# Patient Record
Sex: Male | Born: 1980 | Race: Black or African American | Hispanic: No | Marital: Single | Smoking: Never smoker
Health system: Southern US, Community
[De-identification: ages and names within clinical notes are randomized; demographics above are authoritative.]

## PROBLEM LIST (undated history)

## (undated) DIAGNOSIS — N4400 Torsion of testis, unspecified: Secondary | ICD-10-CM

## (undated) DIAGNOSIS — I10 Essential (primary) hypertension: Secondary | ICD-10-CM

## (undated) HISTORY — PX: TONSILLECTOMY: SUR1361

---
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
## 2002-07-04 ENCOUNTER — Emergency Department (HOSPITAL_COMMUNITY): Admission: AC | Admit: 2002-07-04 | Discharge: 2002-07-04 | Payer: Self-pay

## 2002-07-04 ENCOUNTER — Encounter: Payer: Self-pay | Admitting: Emergency Medicine

## 2002-10-07 HISTORY — PX: OTHER SURGICAL HISTORY: SHX169

## 2003-09-22 ENCOUNTER — Ambulatory Visit (HOSPITAL_BASED_OUTPATIENT_CLINIC_OR_DEPARTMENT_OTHER): Admission: RE | Admit: 2003-09-22 | Discharge: 2003-09-22 | Payer: Self-pay | Admitting: General Surgery

## 2008-10-07 HISTORY — PX: ANTERIOR CRUCIATE LIGAMENT REPAIR: SHX115

## 2010-10-07 HISTORY — PX: MENISCUS REPAIR: SHX5179

## 2014-10-07 DIAGNOSIS — N4400 Torsion of testis, unspecified: Secondary | ICD-10-CM

## 2014-10-07 HISTORY — DX: Torsion of testis, unspecified: N44.00

## 2014-10-20 ENCOUNTER — Encounter (HOSPITAL_COMMUNITY): Payer: Self-pay | Admitting: Emergency Medicine

## 2014-10-20 ENCOUNTER — Emergency Department (HOSPITAL_COMMUNITY): Payer: Commercial Indemnity | Admitting: Anesthesiology

## 2014-10-20 ENCOUNTER — Emergency Department (HOSPITAL_COMMUNITY): Payer: Commercial Indemnity

## 2014-10-20 ENCOUNTER — Ambulatory Visit (HOSPITAL_COMMUNITY)
Admission: EM | Admit: 2014-10-20 | Discharge: 2014-10-20 | Disposition: A | Discharge status: Home / Self Care | Payer: Commercial Indemnity | Attending: Emergency Medicine | Admitting: Emergency Medicine

## 2014-10-20 ENCOUNTER — Encounter (HOSPITAL_COMMUNITY)
Admission: EM | Disposition: A | Discharge status: Home / Self Care | Payer: Commercial Indemnity | Source: Home / Self Care | Attending: Emergency Medicine

## 2014-10-20 DIAGNOSIS — N4400 Torsion of testis, unspecified: Secondary | ICD-10-CM | POA: Diagnosis not present

## 2014-10-20 DIAGNOSIS — Z87891 Personal history of nicotine dependence: Secondary | ICD-10-CM | POA: Insufficient documentation

## 2014-10-20 DIAGNOSIS — N508 Other specified disorders of male genital organs: Secondary | ICD-10-CM | POA: Diagnosis present

## 2014-10-20 DIAGNOSIS — N50812 Left testicular pain: Secondary | ICD-10-CM

## 2014-10-20 HISTORY — PX: SCROTAL EXPLORATION: SHX2386

## 2014-10-20 LAB — BASIC METABOLIC PANEL WITH GFR
Anion gap: 8 (ref 5–15)
BUN: 14 mg/dL (ref 6–23)
CO2: 26 mmol/L (ref 19–32)
Calcium: 9.6 mg/dL (ref 8.4–10.5)
Chloride: 107 meq/L (ref 96–112)
Creatinine, Ser: 1.2 mg/dL (ref 0.50–1.35)
GFR calc Af Amer: >90 mL/min
GFR calc non Af Amer: 79 mL/min — ABNORMAL LOW
Glucose, Bld: 115 mg/dL — ABNORMAL HIGH (ref 70–99)
Potassium: 4.5 mmol/L (ref 3.5–5.1)
Sodium: 141 mmol/L (ref 135–145)

## 2014-10-20 LAB — CBC WITH DIFFERENTIAL/PLATELET
BASOS ABS: 0 10*3/uL (ref 0.0–0.1)
Basophils Relative: 0 % (ref 0–1)
EOS ABS: 0.3 10*3/uL (ref 0.0–0.7)
EOS PCT: 5 % (ref 0–5)
HEMATOCRIT: 44.2 % (ref 39.0–52.0)
Hemoglobin: 15.5 g/dL (ref 13.0–17.0)
LYMPHS PCT: 33 % (ref 12–46)
Lymphs Abs: 2.5 10*3/uL (ref 0.7–4.0)
MCH: 28.5 pg (ref 26.0–34.0)
MCHC: 35.1 g/dL (ref 30.0–36.0)
MCV: 81.4 fL (ref 78.0–100.0)
MONO ABS: 0.7 10*3/uL (ref 0.1–1.0)
Monocytes Relative: 9 % (ref 3–12)
NEUTROS PCT: 53 % (ref 43–77)
Neutro Abs: 4 10*3/uL (ref 1.7–7.7)
PLATELETS: 248 10*3/uL (ref 150–400)
RBC: 5.43 MIL/uL (ref 4.22–5.81)
RDW: 13.1 % (ref 11.5–15.5)
WBC: 7.5 10*3/uL (ref 4.0–10.5)

## 2014-10-20 SURGERY — EXPLORATION, SCROTUM
Anesthesia: General | Site: Scrotum | Laterality: Bilateral

## 2014-10-20 MED ORDER — BUPIVACAINE HCL (PF) 0.25 % IJ SOLN
INTRAMUSCULAR | Status: DC | PRN
  Administered 2014-10-20: 10 mL

## 2014-10-20 MED ORDER — SUCCINYLCHOLINE CHLORIDE 20 MG/ML IJ SOLN
INTRAMUSCULAR | Status: DC | PRN
  Administered 2014-10-20: 60 mg via INTRAVENOUS

## 2014-10-20 MED ORDER — PROPOFOL 10 MG/ML IV BOLUS
INTRAVENOUS | Status: DC | PRN
  Administered 2014-10-20: 200 mg via INTRAVENOUS

## 2014-10-20 MED ORDER — CEFAZOLIN SODIUM-DEXTROSE 2-3 GM-% IV SOLR
INTRAVENOUS | Status: AC
  Filled 2014-10-20: qty 50

## 2014-10-20 MED ORDER — 0.9 % SODIUM CHLORIDE (POUR BTL) OPTIME
TOPICAL | Status: DC | PRN
  Administered 2014-10-20: 1000 mL

## 2014-10-20 MED ORDER — ONDANSETRON HCL 4 MG/2ML IJ SOLN
INTRAMUSCULAR | Status: AC
  Filled 2014-10-20: qty 2

## 2014-10-20 MED ORDER — CEFAZOLIN SODIUM-DEXTROSE 2-3 GM-% IV SOLR: 2.0000 g | Freq: Once | INTRAVENOUS | Status: DC

## 2014-10-20 MED ORDER — MIDAZOLAM HCL 2 MG/2ML IJ SOLN
INTRAMUSCULAR | Status: AC
  Filled 2014-10-20: qty 2

## 2014-10-20 MED ORDER — BUPIVACAINE HCL (PF) 0.25 % IJ SOLN
INTRAMUSCULAR | Status: AC
  Filled 2014-10-20: qty 30

## 2014-10-20 MED ORDER — ACETAMINOPHEN 325 MG PO TABS: 325.0000 mg | ORAL_TABLET | ORAL | Status: DC | PRN

## 2014-10-20 MED ORDER — PROPOFOL 10 MG/ML IV BOLUS
INTRAVENOUS | Status: AC
  Filled 2014-10-20: qty 20

## 2014-10-20 MED ORDER — OXYCODONE HCL 5 MG PO TABS: 5.0000 mg | ORAL_TABLET | Freq: Once | ORAL | Status: DC | PRN

## 2014-10-20 MED ORDER — HYDROMORPHONE HCL 1 MG/ML IJ SOLN
1.0000 mg | Freq: Once | INTRAMUSCULAR | Status: AC
  Administered 2014-10-20: 1 mg via INTRAVENOUS
  Filled 2014-10-20: qty 1

## 2014-10-20 MED ORDER — OXYCODONE HCL 5 MG PO TABS
ORAL_TABLET | ORAL | Status: AC
  Filled 2014-10-20: qty 1

## 2014-10-20 MED ORDER — FENTANYL CITRATE 0.05 MG/ML IJ SOLN
INTRAMUSCULAR | Status: AC
  Filled 2014-10-20: qty 5

## 2014-10-20 MED ORDER — FENTANYL CITRATE 0.05 MG/ML IJ SOLN
INTRAMUSCULAR | Status: DC | PRN
  Administered 2014-10-20: 100 ug via INTRAVENOUS

## 2014-10-20 MED ORDER — LIDOCAINE HCL (CARDIAC) 20 MG/ML IV SOLN
INTRAVENOUS | Status: AC
  Filled 2014-10-20: qty 5

## 2014-10-20 MED ORDER — SUCCINYLCHOLINE CHLORIDE 20 MG/ML IJ SOLN
INTRAMUSCULAR | Status: AC
Start: 2014-10-20 — End: 2014-10-20
  Filled 2014-10-20: qty 1

## 2014-10-20 MED ORDER — LACTATED RINGERS IV SOLN
INTRAVENOUS | Status: DC | PRN
  Administered 2014-10-20 (×2): via INTRAVENOUS

## 2014-10-20 MED ORDER — ARTIFICIAL TEARS OP OINT
TOPICAL_OINTMENT | OPHTHALMIC | Status: AC
  Filled 2014-10-20: qty 3.5

## 2014-10-20 MED ORDER — HYDROCODONE-ACETAMINOPHEN 5-325 MG PO TABS: 1.0000 | ORAL_TABLET | ORAL | Status: AC | PRN

## 2014-10-20 MED ORDER — OXYCODONE HCL 5 MG/5ML PO SOLN: 5.0000 mg | Freq: Once | ORAL | Status: DC | PRN

## 2014-10-20 MED ORDER — HYDROMORPHONE HCL 1 MG/ML IJ SOLN: 0.2500 mg | INTRAMUSCULAR | Status: DC | PRN

## 2014-10-20 MED ORDER — LIDOCAINE HCL (CARDIAC) 20 MG/ML IV SOLN
INTRAVENOUS | Status: DC | PRN
  Administered 2014-10-20: 80 mg via INTRAVENOUS

## 2014-10-20 MED ORDER — ONDANSETRON HCL 4 MG/2ML IJ SOLN
INTRAMUSCULAR | Status: DC | PRN
  Administered 2014-10-20: 4 mg via INTRAVENOUS

## 2014-10-20 MED ORDER — ACETAMINOPHEN 160 MG/5ML PO SOLN
325.0000 mg | ORAL | Status: DC | PRN
  Filled 2014-10-20: qty 20.3

## 2014-10-20 SURGICAL SUPPLY — 34 items
BAG URO CATCHER STRL LF (DRAPE) IMPLANT
BLADE 10 SAFETY STRL DISP (BLADE) ×3 IMPLANT
BLADE SURG 15 STRL LF DISP TIS (BLADE) IMPLANT
BLADE SURG 15 STRL SS (BLADE)
BNDG GAUZE ELAST 4 BULKY (GAUZE/BANDAGES/DRESSINGS) ×3 IMPLANT
CANISTER SUCTION 2500CC (MISCELLANEOUS) ×3 IMPLANT
CONT SPEC 4OZ CLIKSEAL STRL BL (MISCELLANEOUS) ×3 IMPLANT
DRAPE LAPAROTOMY T 102X78X121 (DRAPES) ×3 IMPLANT
DRSG PAD ABDOMINAL 8X10 ST (GAUZE/BANDAGES/DRESSINGS) ×6 IMPLANT
ELECT REM PT RETURN 9FT ADLT (ELECTROSURGICAL) ×3
ELECTRODE REM PT RTRN 9FT ADLT (ELECTROSURGICAL) ×1 IMPLANT
GAUZE SPONGE 4X4 12PLY STRL (GAUZE/BANDAGES/DRESSINGS) ×3 IMPLANT
GLOVE BIO SURGEON STRL SZ 6.5 (GLOVE) ×4 IMPLANT
GLOVE BIO SURGEONS STRL SZ 6.5 (GLOVE) ×2
GLOVE SURG ORTHO 8.0 STRL STRW (GLOVE) ×3 IMPLANT
GLOVE SURG SS PI 7.0 STRL IVOR (GLOVE) ×6 IMPLANT
KIT BASIN OR (CUSTOM PROCEDURE TRAY) ×3 IMPLANT
KIT ROOM TURNOVER OR (KITS) ×3 IMPLANT
NS IRRIG 1000ML POUR BTL (IV SOLUTION) ×3 IMPLANT
PACK GENERAL/GYN (CUSTOM PROCEDURE TRAY) ×3 IMPLANT
PAD ARMBOARD 7.5X6 YLW CONV (MISCELLANEOUS) ×6 IMPLANT
SOLUTION BETADINE 4OZ (MISCELLANEOUS) ×3 IMPLANT
SPONGE LAP 18X18 X RAY DECT (DISPOSABLE) IMPLANT
SUPPORT SCROTAL LG STRP (MISCELLANEOUS) ×2 IMPLANT
SUPPORT SCROTAL MEDIUM (SOFTGOODS) ×3 IMPLANT
SUPPORTER ATHLETIC LG (MISCELLANEOUS) ×1
SUT CHROMIC 3 0 SH 27 (SUTURE) ×3 IMPLANT
SUT MNCRL AB 4-0 PS2 18 (SUTURE) ×3 IMPLANT
SUT PROLENE 4 0 PS 2 18 (SUTURE) ×3 IMPLANT
SUT VICRYL 4-0 PS2 18IN ABS (SUTURE) IMPLANT
SWAB COLLECTION DEVICE MRSA (MISCELLANEOUS) IMPLANT
TOWEL OR 17X24 6PK STRL BLUE (TOWEL DISPOSABLE) ×3 IMPLANT
TOWEL OR 17X26 10 PK STRL BLUE (TOWEL DISPOSABLE) ×3 IMPLANT
WATER STERILE IRR 1000ML POUR (IV SOLUTION) ×3 IMPLANT

## 2014-10-20 NOTE — ED Notes (Signed)
Pt states that he was driving to work yesterday morning when he started experiencing sharp lower abdominal pain radiating to the left testicle. Pt states that pain continued to worsen, and was not able to sleep. Pt states that his left testicle is swollen. 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

## 2014-10-20 NOTE — Transfer of Care (Signed)
Immediate Anesthesia Transfer of Care Note  Patient: Allen Castaneda  Procedure(s) Performed: Procedure(s): SCROTUM EXPLORATION with bilateral orchiopexy (Bilateral)  Patient Location: PACU  Anesthesia Type:General  Level of Consciousness: awake, sedated and patient cooperative  Airway & Oxygen Therapy: Patient Spontanous Breathing and Patient connected to nasal cannula oxygen  Post-op Assessment: Report given to PACU RN and Post -op Vital signs reviewed and stable  Post vital signs: Reviewed and stable  Complications: No apparent anesthesia complications

## 2014-10-20 NOTE — Op Note (Signed)
Preoperative diagnosis: Left testicle torsion  Postoperative diagnosis: Same, with viable testicle  Principal procedure: Scrotal exploration, bilateral orchidopexy  Surgeon: Eddy Termine  Anesthesia: Gen.  Drains: None  Specimens: None  Indications: 34 year old male who presented to the emergency room about 3 hours ago with a 24-hour history of left testicular pain. Scrotal ultrasound with Doppler revealed no flow to the left testicle with evidence of torsion. The patient presents at this time for scrotal exploration, possible left orchiectomy versus orchiopexy, right orchidopexy. Risks and complications of the procedure have been discussed, including the need for possible removal of the testicle. He understands and desires to proceed.  Description of procedure:  Patient was identified and properly marked in the holding area. He received 2 g of Ancef IV preoperatively. He was taken the operating room where general anesthetic was administered. He is placed in the recumbent position on the table. Perineum and lower abdomen were prepped and draped. Proper timeout was performed.  A 2 cm incision was made in the anterior median raphae and carried down to the left tunica vaginalis with electrocautery. The tunica was divided anteriorly, and the testicle underneath identified. There was a 360 torsion. There was significant edema of the epididymis and the appendix testicle. The testicle did not appear to be infarcted. I detorsed the testicle for observation. I then incised down to the right tunica vaginalis with electrocautery. The tunica vaginalis was divided, and the testicle on the right inspected. This was found to be normal. Epididymal structures were normal as well. Orchidopexy was then performed using 4-0 Prolene, tacking the right tunica albuginea into the dartos fascia in 3 separate sutures placed in a triangular fashion. Inspection was then turned to the left testicle and epididymis. There seem to  be adequate circulation, and there was no evidence of infarction, as after 3-4 minutes, the left testicle, although slightly edematous with significant edema of the epididymis, seem to be viable in appearance. Orchidopexy on the left was then performed in the same way with 4-0 Prolene, approximating the tunica albuginea to the dartos in a triangular sort of fashion with 3 separate sutures. The cord was then blocked with quarter percent Marcaine, plain. After both testicles were returned to their respective hemiscrotum, the dartos fascia was reapproximated using a running 3-0 chromic placed in a simple fashion. 4-0 Monocryl was then used to reapproximate the wound edges, placed in a simple running fashion. Fluffs were then placed anteriorly on the incision, and a compressive jockstrap was placed.  The patient tolerated procedure well. He was awakened and taken to the PACU in stable condition.

## 2014-10-20 NOTE — H&P (Signed)
Urology History and Physical Exam  CC: Left testicular pain  HPI: 34 year old male presented to the emergency room here about 2 hours ago with a 24-hour history of left testicular pain. This was of sudden onset on his way to work yesterday. The patient has had persistent pain, as well as nausea. He presented to the emergency room where scrotal ultrasound with Doppler flow revealed left testicular torsion. The patient states that 2 years ago, he had a similar episode of pain, and presented [place] [facility] [place], where apparently he was manually detorsed. He has not had urologic follow-up since that time.  At this time, he comes to the operating room for scrotal duration, possible left orchiectomy, possible bilateral orchidopexy.  PMH: History reviewed. No pertinent past medical history.  PSH: Past Surgical History  Procedure Laterality Date  . Inguinal hernia repair  2004  . Anterior cruciate ligament repair Right 2010  . Anterior cruciate ligament repair Left 2002  . Meniscus repair Right 2012  . Tonsillectomy      Allergies: Allergies  Allergen Reactions  . Other     Pt states that he was given a pain medication after a surgery that caused him to have hives, but he is unsure of the name of the medication.     Medications: No prescriptions prior to admission     Social History: History   Social History  . Marital Status: Single    Spouse Name: N/A    Number of Children: N/A  . Years of Education: N/A   Occupational History  . Not on file.   Social History Main Topics  . Smoking status: Former Games developermoker  . Smokeless tobacco: Not on file  . Alcohol Use: No  . Drug Use: No  . Sexual Activity: Not on file   Other Topics Concern  . Not on file   Social History Narrative  . No narrative on file    Family History: History reviewed. No pertinent family history.  Review of Systems: Positive: Left testicular pain, nausea and  vomiting Negative  A further 10 point review of systems was negative except what is listed in the HPI.                  Physical Exam: @VITALS2 @ General: No acute distress.  Awake. Head:  Normocephalic.  Atraumatic. ENT:  EOMI.  Mucous membranes moist Neck:  Supple.  No lymphadenopathy. CV:  S1 present. S2 present. Regular rate. Pulmonary: Equal effort bilaterally.  Clear to auscultation bilaterally. Abdomen: Soft.  Non- tender to palpation. Skin:  Normal turgor.  No visible rash. Extremity: No gross deformity of bilateral upper extremities.  No gross deformity of                             lower extremities. Neurologic: Alert. Appropriate mood.  Penis:  Circumcised.  No lesions. Urethra: Orthotopic meatus. Scrotum: No lesions.  No ecchymosis.  No erythema. Testicles: Descended bilaterally.  The left testicle is high riding and tender, as well as swollen. There is no cremaster reflex on the left. Epididymis: Palpable bilaterally. Left epididymis is ectopic.  Studies:  Recent Labs     10/20/14  0543  HGB  15.5  WBC  7.5  PLT  248    Recent Labs     10/20/14  0543  NA  141  K  4.5  CL  107  CO2  26  BUN  14  CREATININE  1.20  CALCIUM  9.6  GFRNONAA  79*  GFRAA  >90    I reviewed the patient's scrotal ultrasound/Doppler study. There is no flow on the left side, a reactive hydrocele on that side. Epididymis is significantly swollen. No results for input(s): INR, APTT in the last 72 hours.  Invalid input(s): PT   Invalid input(s): ABG    Assessment:  Left testicular torsion  Plan: Scrotal exploration, probable left orchiectomy, possible bilateral orchidopexy

## 2014-10-20 NOTE — Anesthesia Postprocedure Evaluation (Signed)
  Anesthesia Post-op Note  Patient: Allen Castaneda  Procedure(s) Performed: Procedure(s): SCROTUM EXPLORATION with bilateral orchiopexy (Bilateral)  Patient Location: PACU  Anesthesia Type:General  Level of Consciousness: awake  Airway and Oxygen Therapy: Patient Spontanous Breathing  Post-op Pain: mild  Post-op Assessment: Post-op Vital signs reviewed, Patient's Cardiovascular Status Stable, Respiratory Function Stable, Patent Airway, No signs of Nausea or vomiting and Pain level controlled  Post-op Vital Signs: Reviewed  Last Vitals:  Filed Vitals:   10/20/14 0919  BP: 146/91  Pulse: 84  Temp:   Resp:     Complications: No apparent anesthesia complications

## 2014-10-20 NOTE — Progress Notes (Signed)
Receiving Report from Diane RN at this time

## 2014-10-20 NOTE — Discharge Instructions (Signed)
1. it is okay to get back to regular activities tomorrow  2. It is okay to shower starting tomorrow, just do not sit in a bathtub for about a week  3. No special wound care is needed. Wear a scrotal support or tight undershorts for the next week or so-this will make your scrotal area feel better. Also use an ice pack over your scrotal area today  4. It is okay to take the prescribed pain medicine, but Advil or Aleve is all you may need  5. Call for fever over 100, or any wound issues  6. You can eat a light lunch, but it back to regular food this evening  7. Call for an appointment to see Dr. Retta Dionesahlstedt [phone]) within the next 3-4 weeks, sooner if you're having any specific issues.  8. There are no stitches that need to be removed-they will fall out on their own  What to eat:  For your first meals, you should eat lightly; only small meals initially.  If you do not have nausea, you may eat larger meals.  Avoid spicy, greasy and heavy food.    General Anesthesia, Adult, Care After  Refer to this sheet in the next few weeks. These instructions provide you with information on caring for yourself after your procedure. Your health care provider may also give you more specific instructions. Your treatment has been planned according to current medical practices, but problems sometimes occur. Call your health care provider if you have any problems or questions after your procedure.  WHAT TO EXPECT AFTER THE PROCEDURE  After the procedure, it is typical to experience:  Sleepiness.  Nausea and vomiting. HOME CARE INSTRUCTIONS  For the first 24 hours after general anesthesia:  Have a responsible person with you.  Do not drive a car. If you are alone, do not take public transportation.  Do not drink alcohol.  Do not take medicine that has not been prescribed by your health care provider.  Do not sign important papers or make important decisions.  You may resume a normal diet and activities as  directed by your health care provider.  Change bandages (dressings) as directed.  If you have questions or problems that seem related to general anesthesia, call the hospital and ask for the anesthetist or anesthesiologist on call. SEEK MEDICAL CARE IF:  You have nausea and vomiting that continue the day after anesthesia.  You develop a rash. SEEK IMMEDIATE MEDICAL CARE IF:  You have difficulty breathing.  You have chest pain.  You have any allergic problems. Document Released: 12/30/2000 Document Revised: 05/26/2013 Document Reviewed: 04/08/2013  [facility] Patient Information 2014 [employer], [place].

## 2014-10-20 NOTE — Anesthesia Preprocedure Evaluation (Addendum)
Anesthesia Evaluation  Patient identified by MRN, date of birth, ID band Patient awake    Reviewed: Allergy & Precautions, NPO status , Patient's Chart, lab work & pertinent test results  Airway Mallampati: II  TM Distance: >3 FB Neck ROM: Full    Dental  (+) Teeth Intact, Dental Advisory Given   Pulmonary neg shortness of breath, neg sleep apnea, neg COPDneg recent URI, former smoker,          Cardiovascular negative cardio ROS      Neuro/Psych negative neurological ROS     GI/Hepatic Neg liver ROS,   Endo/Other  negative endocrine ROS  Renal/GU negative Renal ROS     Musculoskeletal negative musculoskeletal ROS (+)   Abdominal   Peds  Hematology negative hematology ROS (+)   Anesthesia Other Findings   Reproductive/Obstetrics                            Anesthesia Physical Anesthesia Plan  ASA: I and emergent  Anesthesia Plan: General   Post-op Pain Management:    Induction: Intravenous, Rapid sequence and Cricoid pressure planned  Airway Management Planned: Oral ETT  Additional Equipment: None  Intra-op Plan:   Post-operative Plan: Extubation in OR  Informed Consent: I have reviewed the patients History and Physical, chart, labs and discussed the procedure including the risks, benefits and alternatives for the proposed anesthesia with the patient or authorized representative who has indicated his/her understanding and acceptance.   Dental advisory given  Plan Discussed with: CRNA and Surgeon  Anesthesia Plan Comments:         Anesthesia Quick Evaluation

## 2014-10-20 NOTE — Anesthesia Procedure Notes (Signed)
Procedure Name: Intubation Date/Time: 10/20/2014 7:36 AM Performed by: Marni GriffonJAMES, Allen Wallington B Pre-anesthesia Checklist: Patient identified, Emergency Drugs available, Suction available and Patient being monitored Patient Re-evaluated:Patient Re-evaluated prior to inductionOxygen Delivery Method: Circle system utilized Preoxygenation: Pre-oxygenation with 100% oxygen Intubation Type: IV induction Ventilation: Mask ventilation without difficulty Grade View: Grade II Tube type: Oral Tube size: 7.5 mm Number of attempts: 1 Airway Equipment and Method: Stylet Placement Confirmation: ETT inserted through vocal cords under direct vision,  breath sounds checked- equal and bilateral and positive ETCO2 Secured at: 21 (cm at teeth) cm Tube secured with: Tape Dental Injury: Teeth and Oropharynx as per pre-operative assessment

## 2014-10-20 NOTE — ED Notes (Signed)
Pt transported to the OR by Denny PeonErin, RCharity fundraiser

## 2014-10-20 NOTE — ED Provider Notes (Signed)
CSN: [id number]     Arrival date & time 10/20/14  0455 History   First MD Initiated Contact with Patient 10/20/14 0522     Chief Complaint  Patient presents with  . Testicle Pain     (Consider location/radiation/quality/duration/timing/severity/associated sxs/prior Treatment) HPI [age] yo male presents to the ER from home with complaint of left testicle pain.  Pt reports onset of left groin pain followed by testicular pain yesterday morning around 830 am.  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  Pain has been getting increasingly worse, and he has noticed increased swelling.  Pt reports he was seen at [facility] in past with similar pain, was detorsed in the UC,  He denies being told to f/u with urology.  No fevers, chills, dysuria. History reviewed. No pertinent past medical history. Past Surgical History  Procedure Laterality Date  . Inguinal hernia repair  2004  . Anterior cruciate ligament repair Right 2010  . Anterior cruciate ligament repair Left 2002  . Meniscus repair Right 2012  . Tonsillectomy     History reviewed. No pertinent family history. History  Substance Use Topics  . Smoking status: Former Games developermoker  . Smokeless tobacco: Not on file  . Alcohol Use: No    Review of Systems  See History of Present Illness; otherwise all other systems are reviewed and negative   Allergies  Other  Home Medications   Prior to Admission medications   Not on File   BP 136/101 mmHg  Pulse 75  Temp(Src) 97.8 F (36.6 C) (Oral)  Resp 16  SpO2 94% Physical Exam  Constitutional: He is oriented to person, place, and time. He appears well-developed and well-nourished. He appears distressed.  HENT:  Head: Normocephalic and atraumatic.  Nose: Nose normal.  Mouth/Throat: Oropharynx is clear and moist.  Eyes: Conjunctivae and EOM are normal. Pupils are equal, round, and reactive to light.  Neck: Normal range of motion. Neck supple. No JVD present. No tracheal deviation present. No  thyromegaly present.  Cardiovascular: Normal rate, regular rhythm, normal heart sounds and intact distal pulses.  Exam reveals no gallop and no friction rub.   No murmur heard. Pulmonary/Chest: Effort normal and breath sounds normal. No stridor. No respiratory distress. He has no wheezes. He has no rales. He exhibits no tenderness.  Abdominal: Soft. Bowel sounds are normal. He exhibits no distension and no mass. There is no tenderness. There is no rebound and no guarding.  Genitourinary:  Patient with swelling to left scrotum.  Attempted detorsion maneuver unsuccessful.  Patient reports pain is worse to the inferior posterior part of the left testicle.  Cremasteric reflexes intact.  Left testicle has higher lie than the right.  Musculoskeletal: Normal range of motion. He exhibits no edema or tenderness.  Lymphadenopathy:    He has no cervical adenopathy.  Neurological: He is alert and oriented to person, place, and time. He displays normal reflexes. He exhibits normal muscle tone. Coordination normal.  Skin: Skin is warm and dry. No rash noted. No erythema. No pallor.  Psychiatric: He has a normal mood and affect. His behavior is normal. Judgment and thought content normal.  Nursing note and vitals reviewed.   ED Course  Procedures (including critical care time) Labs Review Labs Reviewed  BASIC METABOLIC PANEL - Abnormal; Notable for the following:    Glucose, Bld 115 (*)    GFR calc non Af Amer 79 (*)    All other components within normal limits  CBC WITH DIFFERENTIAL  URINALYSIS,  ROUTINE W REFLEX MICROSCOPIC    Imaging Review US Scrotum  10/20/2014   CLINICAL DATA:  Acute onset of left testicular pain for 22 hours. Initial encounter.  EXAM: SCROTAL ULTRASOUND  DOPPLER ULTRASOUND OF THE TESTICLES  TECHNIQUE: Complete ultrasound examination of the testicles, epididymis, and other scrotal structures was performed. Color and spectral Doppler ultrasound were also utilized to evaluate blood  flow to the testicles.  COMPARISON:  None.  FINDINGS: Right testicle  Measurements: 5.2 x 3.1 x 3.5 cm. No mass or microlithiasis visualized.  Left testicle  Measurements: 4.5 x 3.6 x 3.7 cm. No mass or microlithiasis visualized.  Right epididymis: A 0.6 cm cyst is noted at the right epididymal head.  Left epididymis:  Question of mild edema at the left epididymis.  Hydrocele:  A moderate left-sided hydrocele is noted.  Varicocele:  None visualized.  Pulsed Doppler interrogation of the right testis demonstrates low resistance arterial and venous waveforms.  No blood flow is noted within the left testis and left epididymis, compatible with torsion.  IMPRESSION: 1. Left-sided testicular torsion and epididymal torsion noted. 2. Moderate left-sided hydrocele noted. 3. Small right epididymal head cyst noted.  Critical Value/emergent results were called by telephone at the time of interpretation on 10/20/2014 at 6:07 am to Dr. Marisa Severin, who verbally acknowledged these results.   Electronically Signed   By: Roanna Raider M.D.   On: 10/20/2014 06:17   [place] Art/ven Flow Abd Pelv Doppler  10/20/2014   CLINICAL DATA:  Acute onset of left testicular pain for 22 hours. Initial encounter.  EXAM: SCROTAL ULTRASOUND  DOPPLER ULTRASOUND OF THE TESTICLES  TECHNIQUE: Complete ultrasound examination of the testicles, epididymis, and other scrotal structures was performed. Color and spectral Doppler ultrasound were also utilized to evaluate blood flow to the testicles.  COMPARISON:  None.  FINDINGS: Right testicle  Measurements: 5.2 x 3.1 x 3.5 cm. No mass or microlithiasis visualized.  Left testicle  Measurements: 4.5 x 3.6 x 3.7 cm. No mass or microlithiasis visualized.  Right epididymis: A 0.6 cm cyst is noted at the right epididymal head.  Left epididymis:  Question of mild edema at the left epididymis.  Hydrocele:  A moderate left-sided hydrocele is noted.  Varicocele:  None visualized.  Pulsed Doppler interrogation of the right  testis demonstrates low resistance arterial and venous waveforms.  No blood flow is noted within the left testis and left epididymis, compatible with torsion.  IMPRESSION: 1. Left-sided testicular torsion and epididymal torsion noted. 2. Moderate left-sided hydrocele noted. 3. Small right epididymal head cyst noted.  Critical Value/emergent results were called by telephone at the time of interpretation on 10/20/2014 at 6:07 am to Dr. Marisa Severin, who verbally acknowledged these results.   Electronically Signed   By: Roanna Raider M.D.   On: 10/20/2014 06:17     EKG Interpretation None     Results for orders placed or performed during the hospital encounter of 10/20/14  CBC with Differential  Result Value Ref Range   WBC 7.5 4.0 - 10.5 K/uL   RBC 5.43 4.22 - 5.81 MIL/uL   Hemoglobin 15.5 13.0 - 17.0 g/dL   HCT 09.8 11.9 - 14.7 %   MCV 81.4 78.0 - 100.0 fL   MCH 28.5 26.0 - 34.0 pg   MCHC 35.1 30.0 - 36.0 g/dL   RDW 82.9 56.2 - 13.0 %   Platelets 248 150 - 400 K/uL   Neutrophils Relative % 53 43 - 77 %   Neutro  Abs 4.0 1.7 - 7.7 K/uL   Lymphocytes Relative 33 12 - 46 %   Lymphs Abs 2.5 0.7 - 4.0 K/uL   Monocytes Relative 9 3 - 12 %   Monocytes Absolute 0.7 0.1 - 1.0 K/uL   Eosinophils Relative 5 0 - 5 %   Eosinophils Absolute 0.3 0.0 - 0.7 K/uL   Basophils Relative 0 0 - 1 %   Basophils Absolute 0.0 0.0 - 0.1 K/uL  Basic metabolic panel  Result Value Ref Range   Sodium 141 135 - 145 mmol/L   Potassium 4.5 3.5 - 5.1 mmol/L   Chloride 107 96 - 112 mEq/L   CO2 26 19 - 32 mmol/L   Glucose, Bld 115 (H) 70 - 99 mg/dL   BUN 14 6 - 23 mg/dL   Creatinine, Ser 1.61 0.50 - 1.35 mg/dL   Calcium 9.6 8.4 - 09.6 mg/dL   GFR calc non Af Amer 79 (L) >90 mL/min   GFR calc Af Amer >90 >90 mL/min   Anion gap 8 5 - 15   US Scrotum  10/20/2014   CLINICAL DATA:  Acute onset of left testicular pain for 22 hours. Initial encounter.  EXAM: SCROTAL ULTRASOUND  DOPPLER ULTRASOUND OF THE TESTICLES   TECHNIQUE: Complete ultrasound examination of the testicles, epididymis, and other scrotal structures was performed. Color and spectral Doppler ultrasound were also utilized to evaluate blood flow to the testicles.  COMPARISON:  None.  FINDINGS: Right testicle  Measurements: 5.2 x 3.1 x 3.5 cm. No mass or microlithiasis visualized.  Left testicle  Measurements: 4.5 x 3.6 x 3.7 cm. No mass or microlithiasis visualized.  Right epididymis: A 0.6 cm cyst is noted at the right epididymal head.  Left epididymis:  Question of mild edema at the left epididymis.  Hydrocele:  A moderate left-sided hydrocele is noted.  Varicocele:  None visualized.  Pulsed Doppler interrogation of the right testis demonstrates low resistance arterial and venous waveforms.  No blood flow is noted within the left testis and left epididymis, compatible with torsion.  IMPRESSION: 1. Left-sided testicular torsion and epididymal torsion noted. 2. Moderate left-sided hydrocele noted. 3. Small right epididymal head cyst noted.  Critical Value/emergent results were called by telephone at the time of interpretation on 10/20/2014 at 6:07 am to Dr. Marisa Severin, who verbally acknowledged these results.   Electronically Signed   By: Roanna Raider M.D.   On: 10/20/2014 06:17   [place] Art/ven Flow Abd Pelv Doppler  10/20/2014   CLINICAL DATA:  Acute onset of left testicular pain for 22 hours. Initial encounter.  EXAM: SCROTAL ULTRASOUND  DOPPLER ULTRASOUND OF THE TESTICLES  TECHNIQUE: Complete ultrasound examination of the testicles, epididymis, and other scrotal structures was performed. Color and spectral Doppler ultrasound were also utilized to evaluate blood flow to the testicles.  COMPARISON:  None.  FINDINGS: Right testicle  Measurements: 5.2 x 3.1 x 3.5 cm. No mass or microlithiasis visualized.  Left testicle  Measurements: 4.5 x 3.6 x 3.7 cm. No mass or microlithiasis visualized.  Right epididymis: A 0.6 cm cyst is noted at the right epididymal head.   Left epididymis:  Question of mild edema at the left epididymis.  Hydrocele:  A moderate left-sided hydrocele is noted.  Varicocele:  None visualized.  Pulsed Doppler interrogation of the right testis demonstrates low resistance arterial and venous waveforms.  No blood flow is noted within the left testis and left epididymis, compatible with torsion.  IMPRESSION: 1. Left-sided testicular torsion and  epididymal torsion noted. 2. Moderate left-sided hydrocele noted. 3. Small right epididymal head cyst noted.  Critical Value/emergent results were called by telephone at the time of interpretation on 10/20/2014 at 6:07 am to Dr. Marisa Severin, who verbally acknowledged these results.   Electronically Signed   By: Roanna Raider M.D.   On: 10/20/2014 06:17     MDM   Final diagnoses:  Pain in left testicle  Testicular torsion    34 year old male with left testicular pain for almost 24 hours, worsening over the last several hours.  Case was discussed with Dr. Hillis Range.on-call with urology.  He wishes to be contacted with results of the ultrasound   Ultrasound shows torsion of the left testicle, no blood flow.  Dr. Hillis Range informed of results, he will see the patient in the emergency department.  Patient updated on findings and need for urologic consultation.    Olivia Mackie, MD 10/20/14 [phone]

## 2014-10-24 ENCOUNTER — Encounter (HOSPITAL_COMMUNITY): Payer: Self-pay | Admitting: Urology

## 2017-01-11 ENCOUNTER — Other Ambulatory Visit: Payer: Self-pay

## 2017-01-11 ENCOUNTER — Emergency Department (HOSPITAL_BASED_OUTPATIENT_CLINIC_OR_DEPARTMENT_OTHER)
Admission: EM | Admit: 2017-01-11 | Discharge: 2017-01-11 | Disposition: A | Discharge status: Home / Self Care | Payer: Managed Care, Other (non HMO) | Attending: Emergency Medicine | Admitting: Emergency Medicine

## 2017-01-11 ENCOUNTER — Emergency Department (HOSPITAL_BASED_OUTPATIENT_CLINIC_OR_DEPARTMENT_OTHER): Payer: Managed Care, Other (non HMO)

## 2017-01-11 ENCOUNTER — Encounter (HOSPITAL_BASED_OUTPATIENT_CLINIC_OR_DEPARTMENT_OTHER): Payer: Self-pay | Admitting: *Deleted

## 2017-01-11 DIAGNOSIS — R072 Precordial pain: Secondary | ICD-10-CM | POA: Diagnosis not present

## 2017-01-11 DIAGNOSIS — M546 Pain in thoracic spine: Secondary | ICD-10-CM | POA: Insufficient documentation

## 2017-01-11 DIAGNOSIS — R0789 Other chest pain: Secondary | ICD-10-CM

## 2017-01-11 DIAGNOSIS — R079 Chest pain, unspecified: Secondary | ICD-10-CM

## 2017-01-11 DIAGNOSIS — I10 Essential (primary) hypertension: Secondary | ICD-10-CM | POA: Diagnosis not present

## 2017-01-11 DIAGNOSIS — R509 Fever, unspecified: Secondary | ICD-10-CM | POA: Diagnosis not present

## 2017-01-11 HISTORY — DX: Essential (primary) hypertension: I10

## 2017-01-11 HISTORY — DX: Torsion of testis, unspecified: N44.00

## 2017-01-11 LAB — COMPREHENSIVE METABOLIC PANEL
ALBUMIN: 4.2 g/dL (ref 3.5–5.0)
ALT: 23 U/L (ref 17–63)
ANION GAP: 7 (ref 5–15)
AST: 26 U/L (ref 15–41)
Alkaline Phosphatase: 44 U/L (ref 38–126)
BUN: 15 mg/dL (ref 6–20)
CO2: 26 mmol/L (ref 22–32)
Calcium: 9.1 mg/dL (ref 8.9–10.3)
Chloride: 107 mmol/L (ref 101–111)
Creatinine, Ser: 1.18 mg/dL (ref 0.61–1.24)
GFR calc Af Amer: >60 mL/min (ref >60)
GFR calc non Af Amer: >60 mL/min (ref >60)
Glucose, Bld: 113 mg/dL — ABNORMAL HIGH (ref 65–99)
Potassium: 3.4 mmol/L — ABNORMAL LOW (ref 3.5–5.1)
Sodium: 140 mmol/L (ref 135–145)
Total Bilirubin: 0.6 mg/dL (ref 0.3–1.2)
Total Protein: 7.1 g/dL (ref 6.5–8.1)

## 2017-01-11 LAB — CBC WITH DIFFERENTIAL/PLATELET
BASOS PCT: 0 %
Basophils Absolute: 0 10*3/uL (ref 0.0–0.1)
EOS ABS: 0.2 10*3/uL (ref 0.0–0.7)
Eosinophils Relative: 2 %
HCT: 42.5 % (ref 39.0–52.0)
HEMOGLOBIN: 14.7 g/dL (ref 13.0–17.0)
Lymphocytes Relative: 21 %
Lymphs Abs: 2 10*3/uL (ref 0.7–4.0)
MCH: 28.1 pg (ref 26.0–34.0)
MCHC: 34.6 g/dL (ref 30.0–36.0)
MCV: 81.1 fL (ref 78.0–100.0)
Monocytes Absolute: 0.7 10*3/uL (ref 0.1–1.0)
Monocytes Relative: 8 %
Neutro Abs: 6.5 10*3/uL (ref 1.7–7.7)
Neutrophils Relative %: 69 %
Platelets: 264 10*3/uL (ref 150–400)
RBC: 5.24 MIL/uL (ref 4.22–5.81)
RDW: 13.5 % (ref 11.5–15.5)
WBC: 9.4 10*3/uL (ref 4.0–10.5)

## 2017-01-11 LAB — TROPONIN I: Troponin I: <0.03 ng/mL (ref <0.03)

## 2017-01-11 MED ORDER — IBUPROFEN 400 MG PO TABS
600.0000 mg | ORAL_TABLET | Freq: Once | ORAL | Status: AC
  Administered 2017-01-11: 600 mg via ORAL
  Filled 2017-01-11: qty 1

## 2017-01-11 MED ORDER — IOPAMIDOL (ISOVUE-370) INJECTION 76%
100.0000 mL | Freq: Once | INTRAVENOUS | Status: AC | PRN
  Administered 2017-01-11: 100 mL via INTRAVENOUS

## 2017-01-11 NOTE — ED Notes (Signed)
Unable to get vital signs on pt due to pt in CT. Will attempt later.

## 2017-01-11 NOTE — ED Notes (Signed)
Patient transported to X-ray 

## 2017-01-11 NOTE — ED Notes (Signed)
Pt on cardiac monitor and automatic VS 

## 2017-01-11 NOTE — ED Notes (Signed)
ED Provider at bedside. 

## 2017-01-11 NOTE — ED Notes (Signed)
Alert, NAD, calm, interactive, resps e/u, speaking in clear complete sentences, no dyspnea noted, skin W&D, rep[orts chest pressure, 3/10, (denies: palpitaitions, sob, nausea, dizziness or visual changes).

## 2017-01-11 NOTE — ED Triage Notes (Signed)
Pt reports onset of chest pain that began last night with radiation to back. Denies fever, n/v/d, sob, diaphoresis. Reports hx of HTN and states he stopped taking BP meds because his BP had been normal.

## 2017-01-11 NOTE — ED Triage Notes (Signed)
Reports taking ASA (unsure of dose) PTA.

## 2017-01-11 NOTE — ED Notes (Signed)
Pt given d/c instructions as per chart. Verbalizes understanding. No questions. 

## 2017-01-11 NOTE — Discharge Instructions (Signed)
As discussed, please follow-up with your primary care provider regarding high blood pressure. Ibuprofen for back pain. Return to the emergency department if you experience worsening chest pressure, shortness of breath, if there are any exertional components to your pain, nausea, vomiting, or any other new concerning symptoms.

## 2017-01-11 NOTE — ED Provider Notes (Signed)
[facility] Provider Note   CSN: [id number] Arrival date & time: 01/11/17  1612  By signing my name below, I, Modena Jansky, attest that this documentation has been prepared under the direction and in the presence of non-physician practitioner, Mathews Robinsons, PA-C. Electronically Signed: Modena Jansky, Scribe. 01/11/2017. 5:42 PM.  History   Chief Complaint Chief Complaint  Patient presents with  . Chest Pain   The history is provided by the patient. No language interpreter was used.   HPI Comments: Allen Castaneda is a 36 y.o. male with a PMHx of HTN who presents to the Emergency Department complaining of sternal chest pain that started yesterday afternoon. He states he started having pain while eating. His pain persisted until today with no change in progression. He took aspirin PTA with no relief. He has been non-compliant with his HTN medication for the past 6 months. His pain is relieved by leaning forward and exacerbated by leaning back. He describes the pain as a non-radiating, pressure sensation radiating to his upper left back. He reports associated RUE tingling. He admits to decreased physical activity and dieting over the past few years. Denies any family hx of sudden cardiac death, hx of DM, illicit drug use, recent URI-like symptoms, fever, chills, nasal congestion, diaphoresis, SOB, nausea, vomiting, abdominal pain, or other complaints at this time.  Past Medical History:  Diagnosis Date  . Hypertension   . Testicular torsion 2016    There are no active problems to display for this patient.   Past Surgical History:  Procedure Laterality Date  . ANTERIOR CRUCIATE LIGAMENT REPAIR Right 2010  . ANTERIOR CRUCIATE LIGAMENT REPAIR Left 2002  . inguinal hernia repair  2004  . MENISCUS REPAIR Right 2012  . SCROTAL EXPLORATION Bilateral 10/20/2014   Procedure: SCROTUM EXPLORATION with bilateral orchiopexy;  Surgeon: Chelsea Aus, MD;  Location: [facility] OR;   Service: Urology;  Laterality: Bilateral;  . TONSILLECTOMY         Home Medications    Prior to Admission medications   Medication Sig Start Date End Date Taking? Authorizing Provider  HYDROcodone-acetaminophen (NORCO) 5-325 MG per tablet Take 1-2 tablets by mouth every 4 (four) hours as needed for moderate pain. 10/20/14   Marcine Matar, MD    Family History No family history on file.  Social History Social History  Substance Use Topics  . Smoking status: Never Smoker  . Smokeless tobacco: Never Used  . Alcohol use No     Allergies   Other   Review of Systems Review of Systems  Constitutional: Negative for chills, diaphoresis and fever.  HENT: Negative for congestion.   Respiratory: Negative for cough, choking, chest tightness, shortness of breath, wheezing and stridor.   Cardiovascular: Positive for chest pain. Negative for palpitations and leg swelling.  Gastrointestinal: Negative for abdominal pain, nausea and vomiting.  Musculoskeletal: Positive for back pain (Left upper). Negative for joint swelling and neck stiffness.  Skin: Negative for color change and pallor.  Neurological: Negative for dizziness, seizures and weakness.     Physical Exam Updated Vital Signs BP 162/109 (BP Location: Right Arm) Comment: Pt not taking BP medication. RN aware.  Pulse 83   Temp 97.8 F (36.6 C) (Oral)   Resp 17   Ht  (1.778 m)   Wt 245 lb (111.1 kg)   SpO2 96%   BMI 35.15 kg/m   Physical Exam  Constitutional: He appears well-developed and well-nourished. No distress.  Afebrile, nontoxic-appearing, sitting comfortably in bed  in no acute distress.  HENT:  Head: Normocephalic and atraumatic.  Eyes: Conjunctivae and EOM are normal.  Neck: Normal range of motion.  Cardiovascular: Normal rate, regular rhythm, normal heart sounds and intact distal pulses.   Pulmonary/Chest: Effort normal and breath sounds normal. No respiratory distress. He has no wheezes. He has  no rales. He exhibits no tenderness.  Abdominal: He exhibits no distension.  Musculoskeletal: Normal range of motion. He exhibits no edema, tenderness or deformity.  Neurological: He is alert.  Skin: Skin is warm and dry. No rash noted. He is not diaphoretic. No pallor.  Psychiatric: He has a normal mood and affect.  Nursing note and vitals reviewed.    ED Treatments / Results  DIAGNOSTIC STUDIES: Oxygen Saturation is 99% on RA, normal by my interpretation.    COORDINATION OF CARE: 5:47 PM- Pt advised of plan for treatment and pt agrees.  Labs (all labs ordered are listed, but only abnormal results are displayed) Labs Reviewed  COMPREHENSIVE METABOLIC PANEL - Abnormal; Notable for the following:       Result Value   Potassium 3.4 (*)    Glucose, Bld 113 (*)    All other components within normal limits  CBC WITH DIFFERENTIAL/PLATELET  TROPONIN I    EKG  EKG Interpretation  Date/Time:  Saturday January 11 2017 16:17:55 EDT Ventricular Rate:  94 PR Interval:  150 QRS Duration: 88 QT Interval:  344 QTC Calculation: 430 R Axis:   81 Text Interpretation:  Normal sinus rhythm Possible Left atrial enlargement T wave abnormality, consider inferior ischemia Abnormal ECG No old tracing to compare Confirmed by BELFI  MD, MELANIE ([id number]) on 01/11/2017 4:24:57 PM       Radiology Dg Chest 2 View  Result Date: 01/11/2017 CLINICAL DATA:  Acute onset chest pain, left arm pain, hypertension approximately 24 hours ago . EXAM: CHEST  2 VIEW COMPARISON:  None. FINDINGS: The heart size and mediastinal contours are within normal limits. Both lungs are clear. The visualized skeletal structures are unremarkable. IMPRESSION: Negative.  No active cardiopulmonary disease. Electronically Signed   By: Myles Rosenthal M.D.   On: 01/11/2017 17:21   Ct Angio Chest Aorta W And/or Wo Contrast  Result Date: 01/11/2017 CLINICAL DATA:  Midsternal chest pain radiating to back, evaluate for aortic dissection EXAM:  CT ANGIOGRAPHY CHEST, ABDOMEN AND PELVIS TECHNIQUE: Multidetector CT imaging through the chest, abdomen and pelvis was performed using the standard protocol during bolus administration of intravenous contrast. Multiplanar reconstructed images and MIPs were obtained and reviewed to evaluate the vascular anatomy. CONTRAST:  100 mL Isovue 370 IV COMPARISON:  None. FINDINGS: CTA CHEST FINDINGS Cardiovascular: On unenhanced CT, there is no evidence of intramural hematoma. Preferential opacification of the thoracic aorta. No evidence of thoracic aortic aneurysm or dissection. Satisfactory opacification the pulmonary arteries to the lobar level. No evidence of pulmonary embolism. The heart is normal in size.  No pericardial effusion. Mediastinum/Nodes: No suspicious mediastinal lymphadenopathy. Visualized thyroid is unremarkable. Lungs/Pleura: Lungs are clear. No suspicious pulmonary nodules. No focal consolidation. No pleural effusion or pneumothorax. Musculoskeletal: Gynecomastia. Visualized osseous structures are within normal limits. Review of the MIP images confirms the above findings. CTA ABDOMEN AND PELVIS FINDINGS VASCULAR Aorta: No evidence of abdominal aortic aneurysm or dissection. Celiac: Patent. SMA: Patent. Renals: Bilateral renal arteries are patent. IMA: Patent. Inflow: Patent. 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 Otherwise unremarkable. Review of the MIP images confirms the above  findings. NON-VASCULAR Hepatobiliary: Liver is unremarkable. Gallbladder is unremarkable. No intrahepatic or extrahepatic ductal dilatation. Pancreas: Within normal limits. Spleen: Within normal limits. Adrenals/Urinary Tract: Adrenal glands are within normal limits. Kidneys are within normal limits.  No hydronephrosis. Bladder is within normal limits. Stomach/Bowel: Stomach is within normal limits. No evidence of bowel obstruction. Normal appendix (series 4/ image 153). Lymphatic:  No suspicious abdominopelvic lymphadenopathy. Reproductive: Prostate is unremarkable, noting a dystrophic calcification. Other: No abdominopelvic ascites. Musculoskeletal: Visualized osseous structures are within normal limits. Review of the MIP images confirms the above findings. IMPRESSION: No evidence of thoracoabdominal aortic aneurysm or dissection. No evidence of pulmonary embolism. No evidence of acute cardiopulmonary disease. Negative CT abdomen/pelvis. Electronically Signed   By: Charline Bills M.D.   On: 01/11/2017 19:11   Ct Angio Abd/pel W/ And/or W/o  Result Date: 01/11/2017 CLINICAL DATA:  Midsternal chest pain radiating to back, evaluate for aortic dissection EXAM: CT ANGIOGRAPHY CHEST, ABDOMEN AND PELVIS TECHNIQUE: Multidetector CT imaging through the chest, abdomen and pelvis was performed using the standard protocol during bolus administration of intravenous contrast. Multiplanar reconstructed images and MIPs were obtained and reviewed to evaluate the vascular anatomy. CONTRAST:  100 mL Isovue 370 IV COMPARISON:  None. FINDINGS: CTA CHEST FINDINGS Cardiovascular: On unenhanced CT, there is no evidence of intramural hematoma. Preferential opacification of the thoracic aorta. No evidence of thoracic aortic aneurysm or dissection. Satisfactory opacification the pulmonary arteries to the lobar level. No evidence of pulmonary embolism. The heart is normal in size.  No pericardial effusion. Mediastinum/Nodes: No suspicious mediastinal lymphadenopathy. Visualized thyroid is unremarkable. Lungs/Pleura: Lungs are clear. No suspicious pulmonary nodules. No focal consolidation. No pleural effusion or pneumothorax. Musculoskeletal: Gynecomastia. Visualized osseous structures are within normal limits. Review of the MIP images confirms the above findings. CTA ABDOMEN AND PELVIS FINDINGS VASCULAR Aorta: No evidence of abdominal aortic aneurysm or dissection. Celiac: Patent. SMA: Patent. Renals: Bilateral  renal arteries are patent. IMA: Patent. Inflow: Patent. 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 Otherwise unremarkable. Review of the MIP images confirms the above findings. NON-VASCULAR Hepatobiliary: Liver is unremarkable. Gallbladder is unremarkable. No intrahepatic or extrahepatic ductal dilatation. Pancreas: Within normal limits. Spleen: Within normal limits. Adrenals/Urinary Tract: Adrenal glands are within normal limits. Kidneys are within normal limits.  No hydronephrosis. Bladder is within normal limits. Stomach/Bowel: Stomach is within normal limits. No evidence of bowel obstruction. Normal appendix (series 4/ image 153). Lymphatic: No suspicious abdominopelvic lymphadenopathy. Reproductive: Prostate is unremarkable, noting a dystrophic calcification. Other: No abdominopelvic ascites. Musculoskeletal: Visualized osseous structures are within normal limits. Review of the MIP images confirms the above findings. IMPRESSION: No evidence of thoracoabdominal aortic aneurysm or dissection. No evidence of pulmonary embolism. No evidence of acute cardiopulmonary disease. Negative CT abdomen/pelvis. Electronically Signed   By: Charline Bills M.D.   On: 01/11/2017 19:11    Procedures Procedures (including critical care time)  Medications Ordered in ED Medications  iopamidol (ISOVUE-370) 76 % injection 100 mL (100 mLs Intravenous Contrast Given 01/11/17 1846)  ibuprofen (ADVIL,MOTRIN) tablet 600 mg (600 mg Oral Given 01/11/17 2109)     Initial Impression / Assessment and Plan / ED Course  I have reviewed the triage vital signs and the nursing notes.  Pertinent labs & imaging results that were available during my care of the patient were reviewed by me and considered in my medical decision making (see chart for details).      Patient presents with substernal  pain radiating to upper left back.  Negative cardiac workup and CT angiogram without evidence  of dissection or PE. Labs otherwise unremarkable, reassuring exam. His chest pain is nonexertional in nature and I have low suspicion for CAD as underlying cause. Heart score: 2 There is a possibility for mild pericarditis due to physical exam findings of this faint friction rub, alleviation of symptoms by leaning forward and nonspecific EKG findings. No cardiomegaly on chest x-ray and negative troponin.  Pt reports improvement in pain upon reassessment. Discussed reassuring workup and exam and patient was ready to go home. Discharge home with close follow-up with PCP and ibuprofen.  Discussed with pt the potential risks involved with his high blood blood pressure reading, 184/122, in the ED. Advised to follow up with his PCP to resume his HTN treatment.   Discussed strict return precautions and advised to return to the emergency department if experiencing any new or worsening symptoms. Instructions were understood and patient agreed with discharge plan.  Final Clinical Impressions(s) / ED Diagnoses   Final diagnoses:  Other chest pain  Essential hypertension    New Prescriptions Discharge Medication List as of 01/11/2017  8:59 PM     I personally performed the services described in this documentation, which was scribed in my presence. The recorded information has been reviewed and is accurate.     Georgiana Shore, PA-C 01/12/17 1610    Rolan Bucco, MD 01/12/17 1115

## 2018-08-20 IMAGING — CT CT ANGIO CHEST
3 of 9 series · 18 of 36 positions shown · IV contrast (APPLIED)
Comparison: None.

CLINICAL DATA: Midsternal chest pain radiating to back, evaluate
for aortic dissection

EXAM:
CT ANGIOGRAPHY CHEST, ABDOMEN AND PELVIS
TECHNIQUE: Multidetector CT imaging through the chest, abdomen and pelvis was
performed using the standard protocol during bolus administration of
intravenous contrast. Multiplanar reconstructed images and MIPs were
obtained and reviewed to evaluate the vascular anatomy.
CONTRAST:  100 mL Isovue 370 IV

[Series 4: axial arterial · axial · arterial · 0.80mm/px · z∈[-586,-16]mm · 14 of 220 slices shown]
[im 15/220  lung]
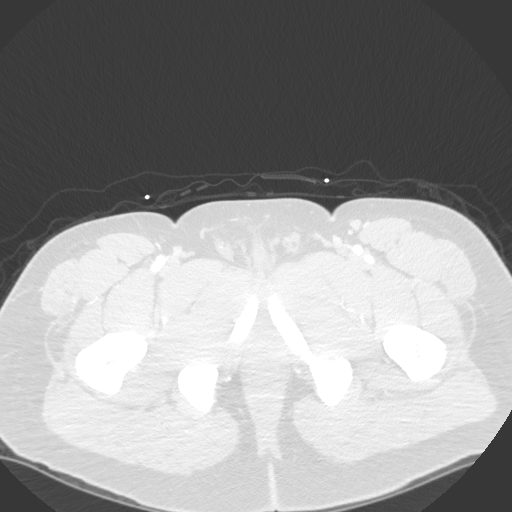
[im 30/220  mediastinal]
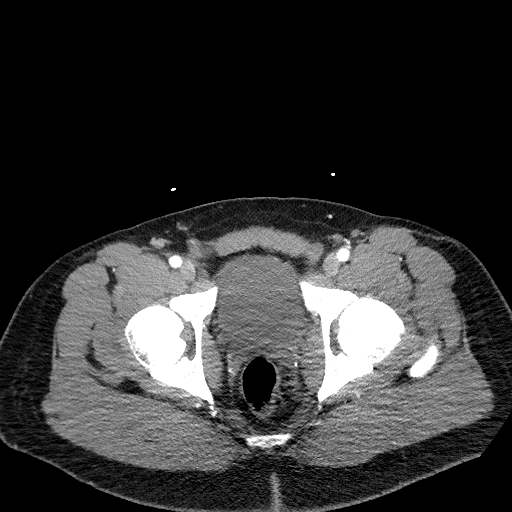
[im 44/220  lung]
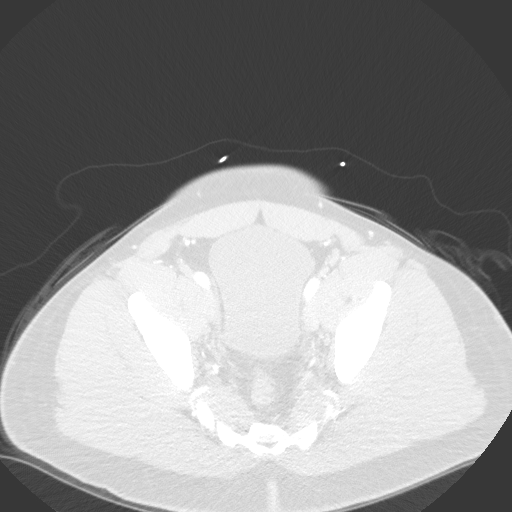
[im 59/220  mediastinal]
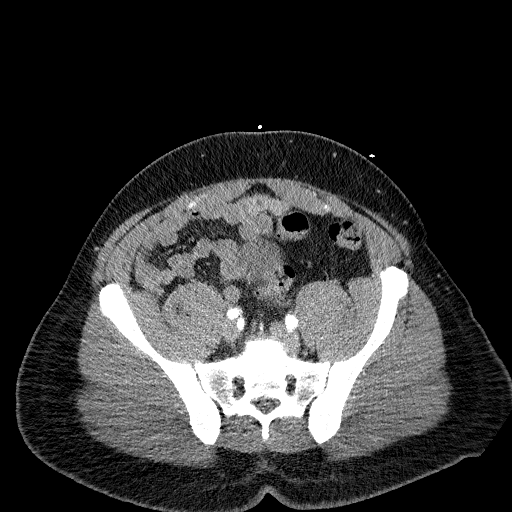
[im 74/220  lung]
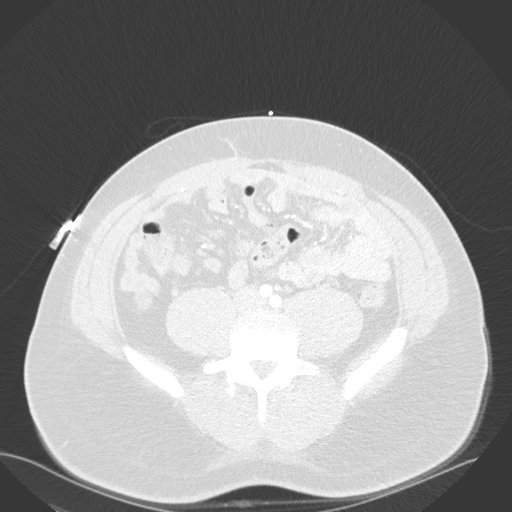
[im 88/220  mediastinal]
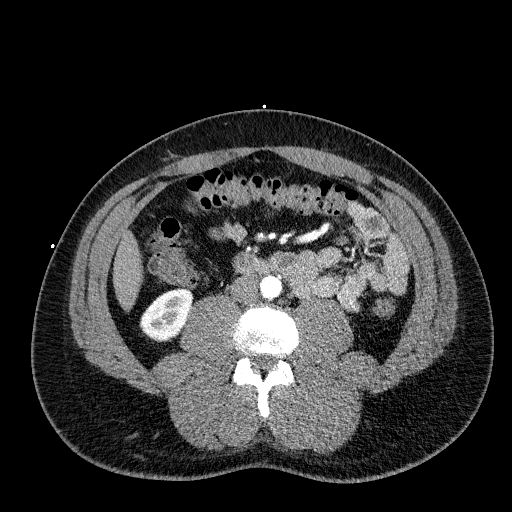
[im 103/220  lung]
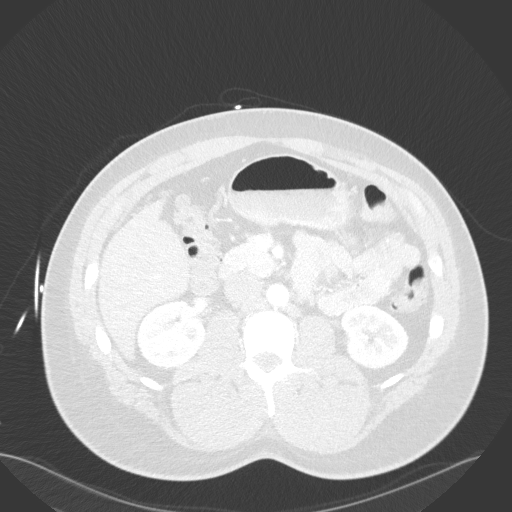
[im 117/220  mediastinal]
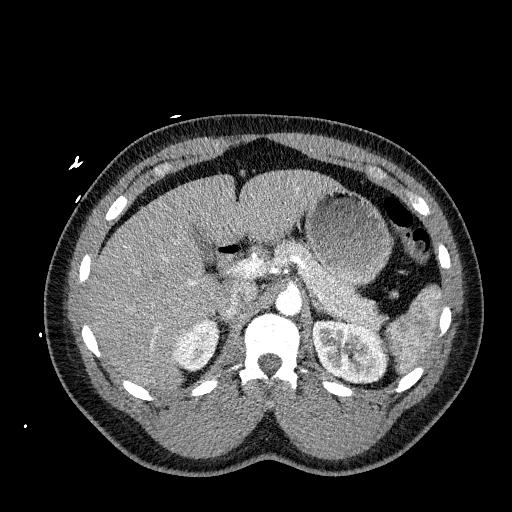
[im 132/220  lung]
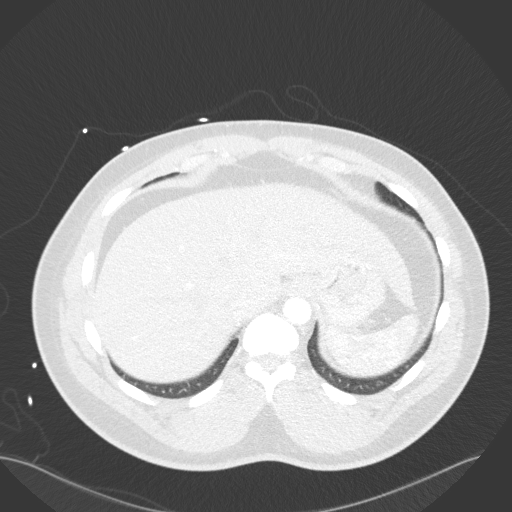
[im 147/220  mediastinal]
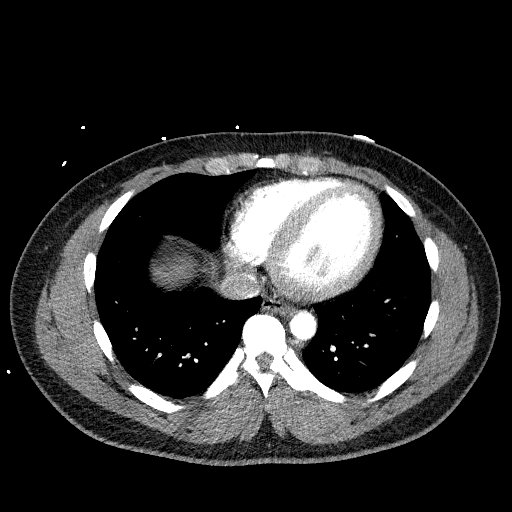
[im 161/220  lung]
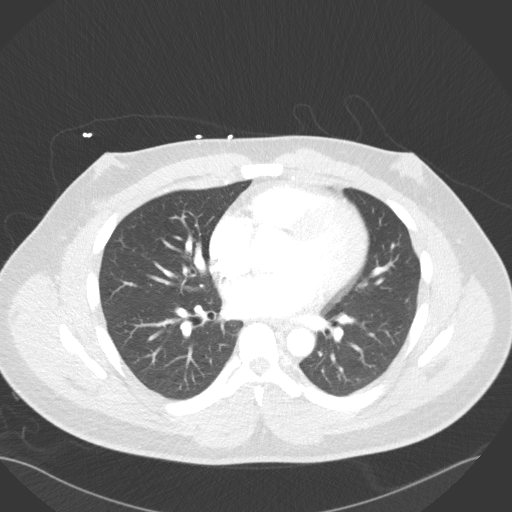
[im 176/220  mediastinal]
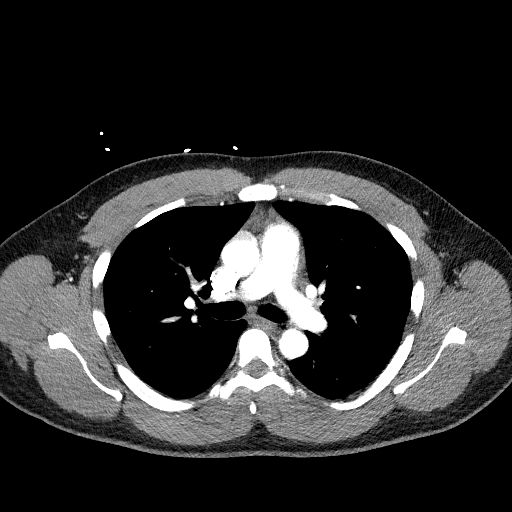
[im 190/220  lung]
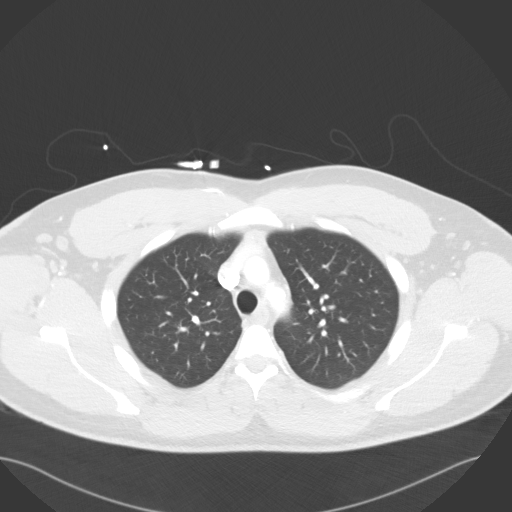
[im 205/220  mediastinal]
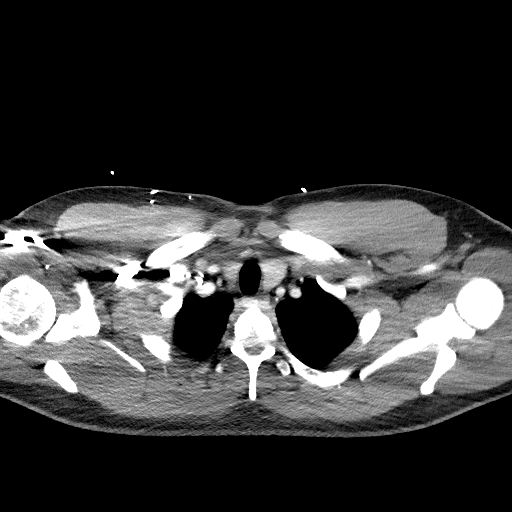

[Series 6: lung · axial · 0.98mm/px · z∈[-201,-51]mm · 3 of 62 slices shown]
[im 16/62  mediastinal]
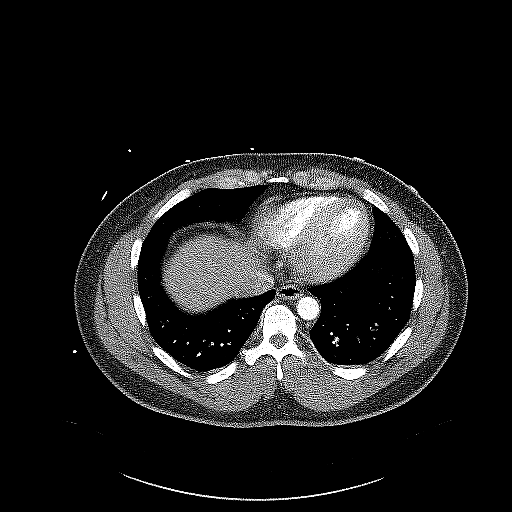
[im 31/62  mediastinal]
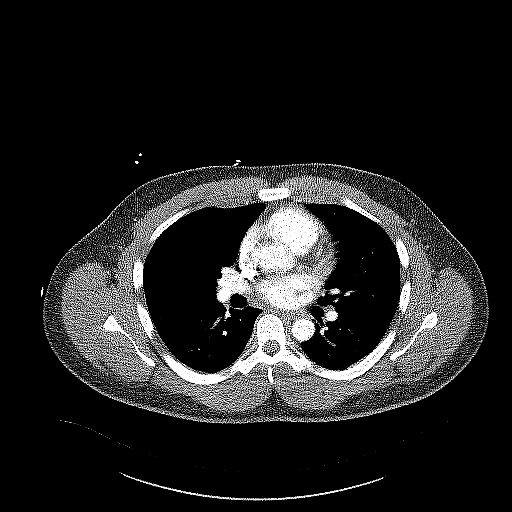
[im 46/62  mediastinal]
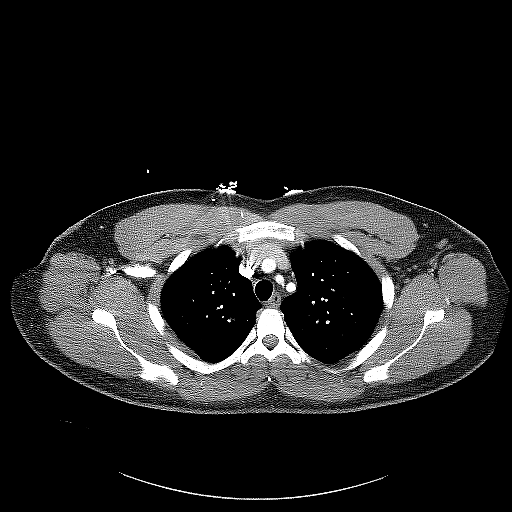

[Series 7: coronals · coronal · 0.98mm/px · 1 of 147 slices shown]
[im 74/147  mediastinal]
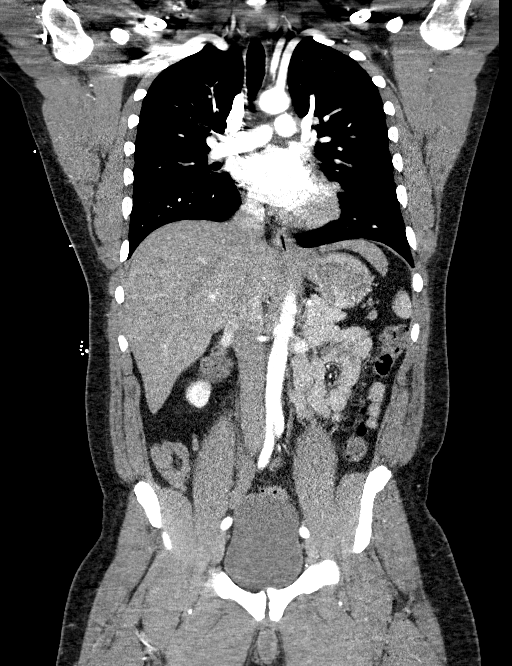

[18 of 36 positions shown; findings below may reference images not displayed]

FINDINGS: CTA CHEST FINDINGS

Cardiovascular: On unenhanced CT, there is no evidence of intramural
hematoma.

Preferential opacification of the thoracic aorta. No evidence of
thoracic aortic aneurysm or dissection.

Satisfactory opacification the pulmonary arteries to the lobar
level. No evidence of pulmonary embolism.

The heart is normal in size.  No pericardial effusion.

Mediastinum/Nodes: No suspicious mediastinal lymphadenopathy.

Visualized thyroid is unremarkable.

Lungs/Pleura: Lungs are clear.

No suspicious pulmonary nodules.

No focal consolidation.

No pleural effusion or pneumothorax.

Musculoskeletal: Gynecomastia.

Visualized osseous structures are within normal limits.

Review of the MIP images confirms the above findings.

CTA ABDOMEN AND PELVIS FINDINGS

VASCULAR

Aorta: No evidence of abdominal aortic aneurysm or dissection.

Celiac: Patent.

SMA: Patent.

Renals: Bilateral renal arteries are patent.

IMA: Patent.

Inflow: Patent.

Veins: Mixing artifact from the SMV into the portal vein (series
4/image 126 and 111). Otherwise unremarkable.

Review of the MIP images confirms the above findings.

NON-VASCULAR

Hepatobiliary: Liver is unremarkable.

Gallbladder is unremarkable. No intrahepatic or extrahepatic ductal
dilatation.

Pancreas: Within normal limits.

Spleen: Within normal limits.

Adrenals/Urinary Tract: Adrenal glands are within normal limits.

Kidneys are within normal limits.  No hydronephrosis.

Bladder is within normal limits.

Stomach/Bowel: Stomach is within normal limits.

No evidence of bowel obstruction.

Normal appendix (series 4/ image 153).

Lymphatic: No suspicious abdominopelvic lymphadenopathy.

Reproductive: Prostate is unremarkable, noting a dystrophic
calcification.

Other: No abdominopelvic ascites.

Musculoskeletal: Visualized osseous structures are within normal
limits.

Review of the MIP images confirms the above findings.
IMPRESSION: No evidence of thoracoabdominal aortic aneurysm or dissection.

No evidence of pulmonary embolism.

No evidence of acute cardiopulmonary disease.

Negative CT abdomen/pelvis.

## 2019-04-29 ENCOUNTER — Other Ambulatory Visit: Payer: Self-pay | Admitting: Internal Medicine

## 2019-04-29 DIAGNOSIS — Z20822 Contact with and (suspected) exposure to covid-19: Secondary | ICD-10-CM

## 2019-05-02 LAB — NOVEL CORONAVIRUS, NAA: SARS-CoV-2, NAA: NOT DETECTED
